# Patient Record
Sex: Male | Born: 1965
Health system: Southern US, Community
[De-identification: ages and names within clinical notes are randomized; demographics above are authoritative.]

## PROBLEM LIST (undated history)

## (undated) DIAGNOSIS — N289 Disorder of kidney and ureter, unspecified: Secondary | ICD-10-CM

## (undated) DIAGNOSIS — G4700 Insomnia, unspecified: Secondary | ICD-10-CM

---

## 2015-04-22 ENCOUNTER — Emergency Department (HOSPITAL_BASED_OUTPATIENT_CLINIC_OR_DEPARTMENT_OTHER): Payer: 59

## 2015-04-22 ENCOUNTER — Emergency Department (HOSPITAL_BASED_OUTPATIENT_CLINIC_OR_DEPARTMENT_OTHER)
Admission: EM | Admit: 2015-04-22 | Discharge: 2015-04-22 | Disposition: A | Discharge status: Home / Self Care | Payer: 59 | Attending: Emergency Medicine | Admitting: Emergency Medicine

## 2015-04-22 ENCOUNTER — Encounter (HOSPITAL_BASED_OUTPATIENT_CLINIC_OR_DEPARTMENT_OTHER): Payer: Self-pay | Admitting: Emergency Medicine

## 2015-04-22 DIAGNOSIS — Y92410 Unspecified street and highway as the place of occurrence of the external cause: Secondary | ICD-10-CM | POA: Insufficient documentation

## 2015-04-22 DIAGNOSIS — S20212A Contusion of left front wall of thorax, initial encounter: Secondary | ICD-10-CM | POA: Insufficient documentation

## 2015-04-22 DIAGNOSIS — S161XXA Strain of muscle, fascia and tendon at neck level, initial encounter: Secondary | ICD-10-CM | POA: Diagnosis not present

## 2015-04-22 DIAGNOSIS — Y998 Other external cause status: Secondary | ICD-10-CM | POA: Diagnosis not present

## 2015-04-22 DIAGNOSIS — S29001A Unspecified injury of muscle and tendon of front wall of thorax, initial encounter: Secondary | ICD-10-CM | POA: Diagnosis present

## 2015-04-22 DIAGNOSIS — Y9389 Activity, other specified: Secondary | ICD-10-CM | POA: Diagnosis not present

## 2015-04-22 DIAGNOSIS — S0081XA Abrasion of other part of head, initial encounter: Secondary | ICD-10-CM | POA: Diagnosis not present

## 2015-04-22 HISTORY — DX: Insomnia, unspecified: G47.00

## 2015-04-22 MED ORDER — HYDROCODONE-ACETAMINOPHEN 5-325 MG PO TABS: 1.0000 | ORAL_TABLET | Freq: Four times a day (QID) | ORAL | Status: AC | PRN

## 2015-04-22 MED FILL — HYDROCODON-APAP 5-325: 5-325 | 2 days supply | Qty: 10 | Fill #0
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
## 2016-01-05 ENCOUNTER — Encounter (HOSPITAL_COMMUNITY): Payer: Self-pay | Admitting: *Deleted

## 2016-01-05 ENCOUNTER — Emergency Department (HOSPITAL_COMMUNITY): Payer: 59

## 2016-01-05 ENCOUNTER — Emergency Department (HOSPITAL_COMMUNITY)
Admission: EM | Admit: 2016-01-05 | Discharge: 2016-01-05 | Disposition: A | Discharge status: Home / Self Care | Payer: 59 | Attending: Emergency Medicine | Admitting: Emergency Medicine

## 2016-01-05 DIAGNOSIS — N200 Calculus of kidney: Secondary | ICD-10-CM

## 2016-01-05 DIAGNOSIS — N202 Calculus of kidney with calculus of ureter: Secondary | ICD-10-CM | POA: Insufficient documentation

## 2016-01-05 DIAGNOSIS — R1031 Right lower quadrant pain: Secondary | ICD-10-CM | POA: Diagnosis present

## 2016-01-05 HISTORY — DX: Disorder of kidney and ureter, unspecified: N28.9

## 2016-01-05 LAB — COMPREHENSIVE METABOLIC PANEL
ALK PHOS: 58 U/L (ref 38–126)
ALT: 31 U/L (ref 17–63)
AST: 28 U/L (ref 15–41)
Albumin: 4.3 g/dL (ref 3.5–5.0)
Anion gap: 8 (ref 5–15)
BILIRUBIN TOTAL: 0.5 mg/dL (ref 0.3–1.2)
BUN: 10 mg/dL (ref 6–20)
CALCIUM: 9.4 mg/dL (ref 8.9–10.3)
CHLORIDE: 104 mmol/L (ref 101–111)
CO2: 26 mmol/L (ref 22–32)
CREATININE: 1.13 mg/dL (ref 0.61–1.24)
GFR calc Af Amer: >60 mL/min (ref >60)
Glucose, Bld: 129 mg/dL — ABNORMAL HIGH (ref 65–99)
Potassium: 4.3 mmol/L (ref 3.5–5.1)
Sodium: 138 mmol/L (ref 135–145)
Total Protein: 6.9 g/dL (ref 6.5–8.1)

## 2016-01-05 LAB — URINALYSIS, ROUTINE W REFLEX MICROSCOPIC
Bacteria, UA: NONE SEEN
Bilirubin Urine: NEGATIVE
Glucose, UA: NEGATIVE mg/dL
Ketones, ur: NEGATIVE mg/dL
LEUKOCYTES UA: NEGATIVE
Nitrite: NEGATIVE
Protein, ur: NEGATIVE mg/dL
SPECIFIC GRAVITY, URINE: 1.019 (ref 1.005–1.030)
pH: 5 (ref 5.0–8.0)

## 2016-01-05 LAB — CBC
HCT: 44.9 % (ref 39.0–52.0)
Hemoglobin: 14.9 g/dL (ref 13.0–17.0)
MCH: 28.5 pg (ref 26.0–34.0)
MCHC: 33.2 g/dL (ref 30.0–36.0)
MCV: 85.9 fL (ref 78.0–100.0)
PLATELETS: 223 10*3/uL (ref 150–400)
RBC: 5.23 MIL/uL (ref 4.22–5.81)
RDW: 12.9 % (ref 11.5–15.5)
WBC: 7.7 10*3/uL (ref 4.0–10.5)

## 2016-01-05 LAB — LIPASE, BLOOD: LIPASE: 24 U/L (ref 11–51)

## 2016-01-05 MED ORDER — HYDROMORPHONE HCL 2 MG/ML IJ SOLN
1.0000 mg | Freq: Once | INTRAMUSCULAR | Status: AC
  Administered 2016-01-05: 1 mg via INTRAVENOUS
  Filled 2016-01-05: qty 1

## 2016-01-05 MED ORDER — KETOROLAC TROMETHAMINE 30 MG/ML IJ SOLN
30.0000 mg | Freq: Once | INTRAMUSCULAR | Status: AC
  Administered 2016-01-05: 30 mg via INTRAVENOUS
  Filled 2016-01-05: qty 1

## 2016-01-05 MED ORDER — OXYCODONE-ACETAMINOPHEN 5-325 MG PO TABS
1.0000 | ORAL_TABLET | Freq: Four times a day (QID) | ORAL | 0 refills | Status: AC | PRN
Start: 2016-01-05 — End: ?

## 2016-01-05 MED ORDER — TAMSULOSIN HCL 0.4 MG PO CAPS: 0.4000 mg | ORAL_CAPSULE | Freq: Every day | ORAL | 0 refills | Status: AC

## 2016-01-05 MED ORDER — ONDANSETRON 4 MG PO TBDP
4.0000 mg | ORAL_TABLET | Freq: Once | ORAL | Status: AC | PRN
  Administered 2016-01-05: 4 mg via ORAL

## 2016-01-05 MED ORDER — FENTANYL CITRATE (PF) 100 MCG/2ML IJ SOLN
50.0000 ug | INTRAMUSCULAR | Status: DC | PRN
  Administered 2016-01-05: 50 ug via NASAL

## 2016-01-05 MED ORDER — FENTANYL CITRATE (PF) 100 MCG/2ML IJ SOLN
INTRAMUSCULAR | Status: AC
  Filled 2016-01-05: qty 2

## 2016-01-05 MED ORDER — ONDANSETRON 4 MG PO TBDP
ORAL_TABLET | ORAL | Status: AC
  Filled 2016-01-05: qty 1

## 2016-01-05 NOTE — Discharge Instructions (Signed)
You have a 2mm kidney stone.  You will likely be able to pass it in the next few days..  Please follow up with urologist as needed for further care.

## 2016-01-05 NOTE — ED Triage Notes (Signed)
Pt states RLQ pain and and nausea, acute onset since 9 am.  Hx of kidney stones and states if feels the same.

## 2016-01-05 NOTE — ED Provider Notes (Signed)
[facility]-EMERGENCY DEPT Provider Note   CSN: [id number] Arrival date & time: 01/05/16  1010     History   Chief Complaint Chief Complaint  Patient presents with  . Abdominal Pain    HPI Carma LeavenWilliam Rasnic is a 50 y.o. male.  HPI   50 year old male with history of kidney stones presents complaining of low abdominal pain. Patient reports 3 hours ago while walking developed acute onset of dull throbbing pain to his right flank and radiates to his right lower quadrant. Pain has been persistent, intense, with associated diaphoresis, nausea, and difficulty urinating. EMS brought her here and patient received fentanyl prior to arrival which provides some relief. Patient states pain feels similar to prior kidney stone that he had in the past which required surgical intervention. He denies any recent injury or strenuous activities. Denies having fever, chest pain, shortness of breath, productive cough, burning urination, hematuria, penile discharge, inguinal hernia, or rash. He has intact appendix.  Past Medical History:  Diagnosis Date  . Insomnia   . Renal disorder    kidney stones    There are no active problems to display for this patient.   History reviewed. No pertinent surgical history.     Home Medications    Prior to Admission medications   Medication Sig Start Date End Date Taking? Authorizing Provider  HYDROcodone-acetaminophen (NORCO) 5-325 MG tablet Take 1-2 tablets by mouth every 6 (six) hours as needed. 04/22/15   Geoffery Lyonsouglas Delo, MD  LORazepam (ATIVAN) 1 MG tablet Take 1 mg by mouth every 8 (eight) hours.    Historical Provider, MD    Family History No family history on file.  Social History Social History  Substance Use Topics  . Smoking status: Never Smoker  . Smokeless tobacco: Current User  . Alcohol use Yes     Comment: occ     Allergies   Patient has no known allergies.   Review of Systems Review of Systems  All other systems reviewed and are  negative.    Physical Exam Updated Vital Signs BP 133/94 (BP Location: Right Arm)   Pulse 66   Temp 97.6 F (36.4 C) (Oral)   Resp 16   Ht 5\' 9"  (1.753 m)   Wt 83.9 kg   SpO2 99%   BMI 27.32 kg/m   Physical Exam  Constitutional: He appears well-developed and well-nourished. No distress.  Patient sitting up in bed appears very uncomfortable  HENT:  Head: Atraumatic.  Eyes: Conjunctivae are normal.  Neck: Neck supple.  Cardiovascular: Normal rate and regular rhythm.   Pulmonary/Chest: Effort normal and breath sounds normal.  Abdominal: Soft. There is tenderness (Mild tenderness to right lower abdomen on palpation without guarding or rebound tenderness. No peritoneal sign. Negative Murphy sign. No pain at McBurney's point.).  Right CVA tenderness on percussion.  Neurological: He is alert.  Skin: No rash noted.  Psychiatric: He has a normal mood and affect.  Nursing note and vitals reviewed.    ED Treatments / Results  Labs (all labs ordered are listed, but only abnormal results are displayed) Labs Reviewed  COMPREHENSIVE METABOLIC PANEL - Abnormal; Notable for the following:       Result Value   Glucose, Bld 129 (*)    All other components within normal limits  URINALYSIS, ROUTINE W REFLEX MICROSCOPIC - Abnormal; Notable for the following:    APPearance HAZY (*)    Hgb urine dipstick LARGE (*)    Squamous Epithelial / LPF 0-5 (*)  All other components within normal limits  LIPASE, BLOOD  CBC    EKG  EKG Interpretation None       Radiology Ct Renal Stone Study  Result Date: 01/05/2016 CLINICAL DATA:  Right flank pain since 9 a.m. this morning. EXAM: CT ABDOMEN AND PELVIS WITHOUT CONTRAST TECHNIQUE: Multidetector CT imaging of the abdomen and pelvis was performed following the standard protocol without IV contrast. COMPARISON:  None. FINDINGS: Lower chest: Moderate hiatal hernia.  Otherwise normal. Hepatobiliary: No focal liver abnormality is seen. No  gallstones, gallbladder wall thickening, or biliary dilatation. Pancreas: Unremarkable. No pancreatic ductal dilatation or surrounding inflammatory changes. Spleen: Normal in size without focal abnormality. Adrenals/Urinary Tract: 2 mm stone obstructing the distal right ureter approximately 1 cm above lead ureterovesical junction creating minimal right hydronephrosis. Multiple tiny calcifications in each kidney. Adrenal glands are normal. Stomach/Bowel: Moderate hiatal hernia.  Otherwise, normal exam. Vascular/Lymphatic: Aortic atherosclerosis.  No adenopathy. Reproductive: Prostate is unremarkable. Other: No abdominal wall hernia or abnormality. No abdominopelvic ascites. Musculoskeletal: No acute or significant osseous findings. IMPRESSION: 2 mm stone obstructing the distal right ureter 1 cm above the ureterovesical junction. Multiple tiny bilateral renal calculi. Moderate hiatal hernia. Aortic atherosclerosis. Electronically Signed   By: Francene BoyersJames  Maxwell M.D.   On: 01/05/2016 13:18    Procedures Procedures (including critical care time)  Medications Ordered in ED Medications  fentaNYL (SUBLIMAZE) injection 50 mcg (50 mcg Nasal Given 01/05/16 1023)  ondansetron (ZOFRAN-ODT) 4 MG disintegrating tablet (not administered)  fentaNYL (SUBLIMAZE) 100 MCG/2ML injection (not administered)  ondansetron (ZOFRAN-ODT) disintegrating tablet 4 mg (4 mg Oral Given 01/05/16 1023)  HYDROmorphone (DILAUDID) injection 1 mg (1 mg Intravenous Given 01/05/16 1150)  ketorolac (TORADOL) 30 MG/ML injection 30 mg (30 mg Intravenous Given 01/05/16 1240)     Initial Impression / Assessment and Plan / ED Course  I have reviewed the triage vital signs and the nursing notes.  Pertinent labs & imaging results that were available during my care of the patient were reviewed by me and considered in my medical decision making (see chart for details).  Clinical Course     BP 132/90   Pulse (!) 53   Temp 97.6 F (36.4 C)  (Oral)   Resp 15   Ht 5\' 9"  (1.753 m)   Wt 83.9 kg   SpO2 99%   BMI 27.32 kg/m    Final Clinical Impressions(s) / ED Diagnoses   Final diagnoses:  Kidney stone on right side    New Prescriptions New Prescriptions   OXYCODONE-ACETAMINOPHEN (PERCOCET/ROXICET) 5-325 MG TABLET    Take 1 tablet by mouth every 6 (six) hours as needed for severe pain.   TAMSULOSIN (FLOMAX) 0.4 MG CAPS CAPSULE    Take 1 capsule (0.4 mg total) by mouth daily.   11:35 AM Patient here with acute onset of right flank and right lower abdominal pain suggestive for potential kidney stone. This is less likely to be appendicitis, aortic dissection, hernia, or other acute pathology. Pain medication given, will obtain CT renal stone study.  2:31 PM CT scan demonstrated evidence of a 2 mm kidney stone at the right UVJ. No other concerning feature. After receiving pain medication, patient felt much better. He is stable for discharge with urine strainer, pain medication, and Flomax. Urology referral given as needed. Return precaution discussed.   Fayrene HelperBowie Hasini Peachey, PA-C 01/05/16 1432    Geoffery Lyonsouglas Delo, MD 01/06/16 1950

## 2016-01-05 NOTE — ED Notes (Signed)
Patient went to CT

## 2016-01-05 NOTE — ED Notes (Signed)
Verified with Greta DoomBowie PA regarding Toradol order since patient may have obstructing kidney stone. Order to go ahead.
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
